# Patient Record
Sex: Female | Born: 1970 | Race: White | Hispanic: No | Marital: Single | Smoking: Current every day smoker
Health system: Southern US, Community
[De-identification: ages and names within clinical notes are randomized; demographics above are authoritative.]

---

## 2019-07-28 ENCOUNTER — Emergency Department (HOSPITAL_COMMUNITY): Payer: Medicaid Other

## 2019-07-28 ENCOUNTER — Other Ambulatory Visit: Payer: Self-pay

## 2019-07-28 ENCOUNTER — Emergency Department (HOSPITAL_COMMUNITY)
Admission: EM | Admit: 2019-07-28 | Discharge: 2019-07-29 | Disposition: A | Discharge status: Home / Self Care | Payer: Medicaid Other | Attending: Emergency Medicine | Admitting: Emergency Medicine

## 2019-07-28 ENCOUNTER — Encounter (HOSPITAL_COMMUNITY): Payer: Self-pay | Admitting: Emergency Medicine

## 2019-07-28 DIAGNOSIS — R42 Dizziness and giddiness: Secondary | ICD-10-CM | POA: Insufficient documentation

## 2019-07-28 DIAGNOSIS — F17210 Nicotine dependence, cigarettes, uncomplicated: Secondary | ICD-10-CM | POA: Insufficient documentation

## 2019-07-28 DIAGNOSIS — R0602 Shortness of breath: Secondary | ICD-10-CM | POA: Insufficient documentation

## 2019-07-28 DIAGNOSIS — R0789 Other chest pain: Secondary | ICD-10-CM | POA: Insufficient documentation

## 2019-07-28 LAB — BASIC METABOLIC PANEL WITH GFR
Anion gap: 8 (ref 5–15)
BUN: 10 mg/dL (ref 6–20)
CO2: 24 mmol/L (ref 22–32)
Calcium: 9.9 mg/dL (ref 8.9–10.3)
Chloride: 107 mmol/L (ref 98–111)
Creatinine, Ser: 0.85 mg/dL (ref 0.44–1.00)
GFR calc Af Amer: >60 mL/min
GFR calc non Af Amer: >60 mL/min
Glucose, Bld: 91 mg/dL (ref 70–99)
Potassium: 3.8 mmol/L (ref 3.5–5.1)
Sodium: 139 mmol/L (ref 135–145)

## 2019-07-28 LAB — TROPONIN I (HIGH SENSITIVITY)
Troponin I (High Sensitivity): 4 ng/L (ref <18)
Troponin I (High Sensitivity): 5 ng/L

## 2019-07-28 LAB — CBC
HCT: 43 % (ref 36.0–46.0)
Hemoglobin: 13.4 g/dL (ref 12.0–15.0)
MCH: 27.4 pg (ref 26.0–34.0)
MCHC: 31.2 g/dL (ref 30.0–36.0)
MCV: 87.9 fL (ref 80.0–100.0)
Platelets: 297 10*3/uL (ref 150–400)
RBC: 4.89 MIL/uL (ref 3.87–5.11)
RDW: 16.5 % — ABNORMAL HIGH (ref 11.5–15.5)
WBC: 10.3 10*3/uL (ref 4.0–10.5)
nRBC: 0 % (ref 0.0–0.2)

## 2019-07-28 LAB — I-STAT BETA HCG BLOOD, ED (MC, WL, AP ONLY): I-stat hCG, quantitative: <5 m[IU]/mL (ref <5)

## 2019-07-28 MED ORDER — SODIUM CHLORIDE 0.9% FLUSH
3.0000 mL | Freq: Once | INTRAVENOUS | Status: AC
  Administered 2019-07-29: 3 mL via INTRAVENOUS

## 2019-07-28 NOTE — ED Triage Notes (Signed)
Pt brought to ED by [facility] from home for CP and SOB that started 1:30 pm today when she was moving to a new house.  BP 164/106, HR 73, R 20, SPO2 99% RA CBG 105.

## 2019-07-29 ENCOUNTER — Emergency Department (HOSPITAL_COMMUNITY): Payer: Medicaid Other

## 2019-07-29 MED ORDER — LIDOCAINE VISCOUS HCL 2 % MT SOLN
15.0000 mL | Freq: Once | OROMUCOSAL | Status: AC
  Administered 2019-07-29: 15 mL via OROMUCOSAL
  Filled 2019-07-29: qty 15

## 2019-07-29 MED ORDER — ALUM & MAG HYDROXIDE-SIMETH 200-200-20 MG/5ML PO SUSP
30.0000 mL | Freq: Once | ORAL | Status: AC
  Administered 2019-07-29: 30 mL via ORAL
  Filled 2019-07-29: qty 30

## 2019-07-29 MED ORDER — LORAZEPAM 2 MG/ML IJ SOLN
1.0000 mg | Freq: Once | INTRAMUSCULAR | Status: AC
  Administered 2019-07-29: 1 mg via INTRAVENOUS
  Filled 2019-07-29: qty 1

## 2019-07-29 MED ORDER — IOHEXOL 350 MG/ML SOLN
100.0000 mL | Freq: Once | INTRAVENOUS | Status: AC | PRN
  Administered 2019-07-29: 100 mL via INTRAVENOUS

## 2019-07-29 NOTE — ED Notes (Signed)
Visual acuity -OD= 20/20, OS 20/20, OU 20/20

## 2019-07-29 NOTE — ED Notes (Signed)
Pt understood discharge instructions, able to stand and ambulate without difficulty,.

## 2019-07-29 NOTE — ED Provider Notes (Signed)
Medical screening examination/treatment/procedure(s) were conducted as a shared visit with non-physician practitioner(s) and myself.  I personally evaluated the patient during the encounter.  Patient presents with a very odd constellation of symptoms.  The main reason why she came in today is because she does have some difficulty swallowing and noticed that her right eyelid was lower than her left.  She was concern for some type of neurologic issue.  She also states she is have some chest pain is going on with this.  She is recently being evicted from her house causing massive amounts of stress and has to move to [place].  She has no upper or lower extremity symptoms. My examination it appears that her left eyelid is slightly weaker than her right and she might have some flattening of the left nasolabial fold and left mouth droop however when she smiles or uses her facial muscles there is no obvious asymmetry.  Upper extremities are symmetric lower extremities are symmetric and respect sensation and strength.  Finger-to-nose is normal.  Visual acuity pending.  She has mild heart murmur.  She states she is never been told she has a heart murmur in the past but she is also significantly hypertensive could be related to that. Overall abnormal constellation of chest and neuro symptoms will get dissection study to make sure that she does not have any vascular compromise also will get MRI of her brain to make sure she does have a history of recent stroke.  Very well could just be stress and then a previous Bell's palsy that she is now having recrudescence. I think if her imaging is negative and her work-up is negative and patient can eat and drink normally she can be discharged to follow-up with neurology as an outpatient.  EKG Interpretation  Date/Time:  Monday July 28 2019 22:01:04 EDT Ventricular Rate:  75 PR Interval:  166 QRS Duration: 84 QT Interval:  370 QTC Calculation: 413 R Axis:   93 Text  Interpretation: Normal sinus rhythm Rightward axis Borderline ECG better than previous, RAD new Confirmed by Marily Memos [id number]) on 07/28/2019 11:47:58 PM     Sigmond Patalano, Barbara Cower, MD 07/30/19 [id number]

## 2019-07-29 NOTE — ED Provider Notes (Signed)
[facility] EMERGENCY DEPARTMENT Provider Note   CSN: [id number] Arrival date & time: 07/28/19  1748     History Chief Complaint  Patient presents with  . Chest Pain    Dominique Moss is a 49 y.o. female with past medical history significant for untreated hypertension.  HPI Patient presents to emergency department today via EMS with chief complaint of acute onset of chest pain, shortness of breath and dizziness starting x 5 hours prior to arrival. Patient states she is currently moving and under a lot of stress. While in the process of moving today she noticed she had  chest pain that she describes as pressure sensation radiating across her chest. She states the chest pain has been persistent and rates it 6/10 in severity. Pain was not worse with exertion. She describes the dizziness as feeling like the room was spinning. She looked in the mirror and noticed that her right eye looked swollen. She had blurry vision from that eye. She laid down to take a nap and when she woke up the symptoms were still going on causing her to become anxious.  After becoming anxious she felt short of breath.  No medications were taken for symptoms prior to arrival.  Denies fever, chills, recent URI illness, rash, diplopia, cough, hemoptysis, dyspnea on exertion, chest tightness, radiation of chest pain to left/right arm, jaw or back, nausea, or diaphoresis. She denies family history of cardiac disease. She does admit to tobacco abuse.     History reviewed. No pertinent past medical history.  There are no problems to display for this patient.   History reviewed. No pertinent surgical history.   OB History   No obstetric history on file.     History reviewed. No pertinent family history.  Social History   Tobacco Use  . Smoking status: Current Every Day Smoker    Packs/day: 1.00    Types: Cigarettes  . Smokeless tobacco: Never Used  Substance Use Topics  . Alcohol use: Yes  .  Drug use: Never    Home Medications Prior to Admission medications   Not on File    Allergies    Patient has no known allergies.  Review of Systems   Review of Systems All other systems are reviewed and are negative for acute change except as noted in the HPI.  Physical Exam Updated Vital Signs BP (!) 142/86   Pulse 64   Temp 97.9 F (36.6 C)   Resp 12   Ht 5\' 6"  (1.676 m)   SpO2 96%   Physical Exam Vitals and nursing note reviewed.  Constitutional:      General: She is not in acute distress.    Appearance: She is not ill-appearing.  HENT:     Head: Normocephalic and atraumatic.     Right Ear: Tympanic membrane and external ear normal.     Left Ear: Tympanic membrane and external ear normal.     Nose: Nose normal.     Mouth/Throat:     Mouth: Mucous membranes are moist.     Pharynx: Oropharynx is clear.  Eyes:     General: No scleral icterus.       Right eye: No discharge.        Left eye: No discharge.     Extraocular Movements: Extraocular movements intact.     Conjunctiva/sclera: Conjunctivae normal.     Pupils: Pupils are equal, round, and reactive to light.  Neck:     Vascular: No JVD.  Cardiovascular:     Rate and Rhythm: Normal rate and regular rhythm.     Pulses: Normal pulses.          Radial pulses are 2+ on the right side and 2+ on the left side.     Heart sounds: Murmur heard.   Pulmonary:     Comments: Lungs clear to auscultation in all fields. Symmetric chest rise. No wheezing, rales, or rhonchi. Abdominal:     Comments: Abdomen is soft, non-distended, and non-tender in all quadrants. No rigidity, no guarding. No peritoneal signs.  Musculoskeletal:        General: Normal range of motion.     Cervical back: Normal range of motion.  Skin:    General: Skin is warm and dry.     Capillary Refill: Capillary refill takes less than 2 seconds.  Neurological:     Mental Status: She is oriented to person, place, and time.     GCS: GCS eye subscore  is 4. GCS verbal subscore is 5. GCS motor subscore is 6.     Comments: Fluent speech. Left eyelid is weaker compared to right. No facial asymmetry.   Mental Status:  Alert, oriented, thought content appropriate, able to give a coherent history. Speech fluent without evidence of aphasia. Able to follow 2 step commands without difficulty.  Cranial Nerves:  II:  Peripheral visual fields grossly normal, pupils equal, round, reactive to light III,IV, VI: ptosis not present, extra-ocular motions intact bilaterally  V,VII: smile symmetric, facial light touch sensation equal VIII: hearing grossly normal to voice  X: uvula elevates symmetrically  XI: bilateral shoulder shrug symmetric and strong XII: midline tongue extension without fassiculations Motor:  Normal tone. 5/5 in upper and lower extremities bilaterally including strong and equal grip strength and dorsiflexion/plantar flexion Sensory: Pinprick and light touch normal in all extremities.  Deep Tendon Reflexes: 2+ and symmetric in the biceps and patella Cerebellar: normal finger-to-nose with bilateral upper extremities Gait: normal gait and balance CV: distal pulses palpable throughout     Psychiatric:        Behavior: Behavior normal.     ED Results / Procedures / Treatments   Labs (all labs ordered are listed, but only abnormal results are displayed) Labs Reviewed  CBC - Abnormal; Notable for the following components:      Result Value   RDW 16.5 (*)    All other components within normal limits  BASIC METABOLIC PANEL  I-STAT BETA HCG BLOOD, ED ([facility], [facility], [facility] ONLY)  TROPONIN I (HIGH SENSITIVITY)  TROPONIN I (HIGH SENSITIVITY)    EKG EKG Interpretation  Date/Time:  Monday July 28 2019 22:01:04 EDT Ventricular Rate:  75 PR Interval:  166 QRS Duration: 84 QT Interval:  370 QTC Calculation: 413 R Axis:   93 Text Interpretation: Normal sinus rhythm Rightward axis Borderline ECG better than previous, RAD new Confirmed by  Marily Memos [phone]) on 07/28/2019 11:47:58 PM   Radiology DG Chest 2 View  Result Date: 07/28/2019 CLINICAL DATA:  Chest pain and shortness of breath EXAM: CHEST - 2 VIEW COMPARISON:  None FINDINGS: No consolidation, features of edema, pneumothorax, or effusion. Pulmonary vascularity is normally distributed. The cardiomediastinal contours are unremarkable. No acute osseous or soft tissue abnormality. IMPRESSION: No acute cardiopulmonary abnormality. Electronically Signed   By: Kreg Shropshire M.D.   On: 07/28/2019 19:08   MR BRAIN WO CONTRAST  Result Date: 07/29/2019 CLINICAL DATA:  Neuro deficits, subacute. EXAM: MRI HEAD WITHOUT CONTRAST TECHNIQUE: Multiplanar, multiecho  pulse sequences of the brain and surrounding structures were obtained without intravenous contrast. COMPARISON:  None. FINDINGS: Some sequences are motion degraded including moderate motion on the sagittal T1 and coronal T2 sequences. Brain: There is no evidence of acute infarct, intracranial hemorrhage, mass, midline shift, or extra-axial fluid collection. The ventricles and sulci are normal. The brain is normal in signal. 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 CSF spaces at the foramen magnum remain patent. The pituitary gland is normal in size. Vascular: Major intracranial vascular flow voids are preserved. Skull and upper cervical spine: Unremarkable bone marrow signal para Sinuses/Orbits: Unremarkable orbits. Small left sphenoid sinus mucous retention cyst. Trace left mastoid fluid. Other: None. IMPRESSION: 1. Motion degraded examination without evidence of acute intracranial abnormality. 2. 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 Electronically Signed   By: Sebastian Ache M.D.   On: 07/29/2019 06:19   CT Angio Chest/Abd/Pel for Dissection W and/or Wo Contrast  Result Date: 07/29/2019 CLINICAL DATA:  Chest pain.   Shortness of breath. EXAM: CT ANGIOGRAPHY CHEST, ABDOMEN AND PELVIS TECHNIQUE: Non-contrast CT of the chest was initially obtained. Multidetector CT imaging through the chest, abdomen and pelvis was performed using the standard protocol during bolus administration of intravenous contrast. Multiplanar reconstructed images and MIPs were obtained and reviewed to evaluate the vascular anatomy. CONTRAST:  OMNIPAQUE IOHEXOL 350 MG/ML SOLN COMPARISON:  Chest radiographs 07/28/2019 FINDINGS: CTA CHEST FINDINGS Cardiovascular: No thoracic aortic intramural hematoma is identified on noncontrast images within limitations of mild motion artifact. There is no evidence of a thoracic aortic dissection or aneurysm. No central pulmonary embolism is identified on this nondedicated study. The heart is normal in size. There is no pericardial effusion. Mediastinum/Nodes: Unremarkable included thyroid. No enlarged axillary, mediastinal, or hilar lymph nodes. Moderate-sized sliding hiatal hernia. Lungs/Pleura: No pleural effusion or pneumothorax. Ill-defined centrilobular ground-glass nodularity bilaterally. No mass or consolidation. Musculoskeletal: No acute osseous abnormality or suspicious osseous lesion. Review of the MIP images confirms the above findings. CTA ABDOMEN AND PELVIS FINDINGS VASCULAR Aorta: Mild abdominal aortic atherosclerosis without aneurysm, dissection, or stenosis. Celiac: Patent without evidence of aneurysm, dissection, vasculitis or significant stenosis. SMA: Patent without evidence of aneurysm, dissection, vasculitis or significant stenosis. Renals: The renal arteries are patent bilaterally without evidence of aneurysm, dissection, vasculitis, fibromuscular dysplasia, or significant stenosis. Accessory renal arteries bilaterally. IMA: Patent without evidence of aneurysm, dissection, vasculitis or significant stenosis. Inflow: Patent without evidence of aneurysm, dissection, vasculitis or significant  stenosis. Veins: No obvious venous abnormality within the limitations of this arterial phase study. Review of the MIP images confirms the above findings. NON-VASCULAR Hepatobiliary: No focal liver abnormality is seen. No gallstones, gallbladder wall thickening, or biliary dilatation. Pancreas: Unremarkable. Spleen: Small splenic calcifications. Adrenals/Urinary Tract: Unremarkable adrenal glands. Excreted contrast in the renal collecting systems limiting assessment for calculi. No hydronephrosis. Subcentimeter low-density lesions in the right kidney, too small to fully characterize. Unremarkable bladder. Stomach/Bowel: There is no evidence of bowel obstruction or inflammation. The appendix is unremarkable. Lymphatic: No enlarged lymph nodes. Reproductive: 4 cm posterior uterine mass. Two homogeneously hypodense foci in the left ovary with the larger measuring 2.3 cm, likely benign follicles. Other: No intraperitoneal free fluid.  No pneumoperitoneum. Musculoskeletal: Moderate disc and facet degeneration at L5-S1 with left greater than right neural foraminal stenosis. Review of the MIP images confirms the above findings. IMPRESSION: 1. No evidence of aortic dissection, aneurysm, or other acute abnormality in  the chest, abdomen, or pelvis. 2. Centrilobular ground-glass nodularity in both lungs which may reflect respiratory bronchiolitis. 3. Moderate-sized hiatal hernia. 4. 4 cm uterine mass, likely a fibroid. 5. Aortic Atherosclerosis (ICD10-I70.0). Electronically Signed   By: Sebastian AcheAllen  Grady M.D.   On: 07/29/2019 07:00    Procedures Procedures (including critical care time)  Medications Ordered in ED Medications  sodium chloride flush (NS) 0.9 % injection 3 mL (3 mLs Intravenous Given 07/29/19 0510)  LORazepam (ATIVAN) injection 1 mg (1 mg Intravenous Given 07/29/19 0509)  iohexol (OMNIPAQUE) 350 MG/ML injection 100 mL (100 mLs Intravenous Contrast Given 07/29/19 0559)  lidocaine (XYLOCAINE) 2 % viscous mouth  solution 15 mL (15 mLs Mouth/Throat Given 07/29/19 0702)  alum & mag hydroxide-simeth (MAALOX/MYLANTA) 200-200-20 MG/5ML suspension 30 mL (30 mLs Oral Given 07/29/19 0702)    ED Course  I have reviewed the triage vital signs and the nursing notes.  Pertinent labs & imaging results that were available during my care of the patient were reviewed by me and considered in my medical decision making (see chart for details).    MDM Rules/Calculators/A&P                          History provided by patient with additional history obtained from chart review.    49 yo female presenting with chest pain and neuro symptoms. She is hypertensive on arrival. She denied any recent URI illness or history of Bells Palsy. Neuro exam shows left eyelid weaker compared to the right, no facial droop with smile, possible flattening of left nasolabial fold at rest.  Neurologic deficits of all extremities.  Finger-to-nose intact.  She does have a heart murmur on exam that she denies being told that in the past.  With her concerning chest pain will proceed with dissection study as she also has a new murmur on exam. ED attending Dr. Clayborne DanaMesner examined patient and discussed case with on call neurologist Dr. Amada JupiterKirkpatrick who recommends MR brain although does feel anxiety could be cause.  CBC, BMP unremarkable.  Pregnancy test is negative.  Delta troponin flat.  EKG without obvious ischemia. Visual acuity normal. I viewed pt's chest xray and it does not suggest acute infectious processes Ativan given. BP and symptoms improved. MR brain negative for stroke.  CT dissection study is also negative. Unsure if this is new Bells Palsy or not given she has no recent URI symptoms, engaged in shared decision making and patient does not wish to have Bells palsy treatment.  Gi cocktail given and on reassessment is tolerating PO intake without difficulty.   Given her symptoms have improved, will have her follow up outpatient with neurology.     The patient appears reasonably screened and/or stabilized for discharge and I doubt any other medical condition or other [facility] requiring further screening, evaluation, or treatment in the ED at this time prior to discharge. The patient is safe for discharge with strict return precautions discussed. Shared ED visit with ED attending.   Portions of this note were generated with Scientist, clinical (histocompatibility and immunogenetics)Dragon dictation software. Dictation errors may occur despite best attempts at proofreading.    Final Clinical Impression(s) / ED Diagnoses Final diagnoses:  Atypical chest pain    Rx / DC Orders ED Discharge Orders    None       Kathyrn Lasslbrizze, Makalynn Berwanger E, PA-C 07/29/19 1828    Mesner, Barbara CowerJason, MD 07/30/19 [id number]

## 2019-07-29 NOTE — Discharge Instructions (Addendum)
MRI did not show stroke.  CT scan showed a hiatal hernia, no other severe findings. This could be an old finding.   You should follow up with a regular doctor to have your blood pressure rechecked as it was elevated today.  Also recommend you follow-up with neurology.  Have given you the contact information that you can call to schedule an appointment for the next available.  Return to the emergency department if your symptoms worsen.

## 2019-07-29 NOTE — ED Notes (Signed)
Patient transported to MRI 

## 2019-07-29 NOTE — ED Notes (Signed)
Speech slurred, sleepy, falls asleep during exam, easy to awaken.

## 2021-04-10 IMAGING — MR MR HEAD W/O CM
12 of 13 series · 43 of 48 positions shown · non-contrast
Comparison: None.

CLINICAL DATA: Neuro deficits, subacute.

EXAM:
MRI HEAD WITHOUT CONTRAST
TECHNIQUE: Multiplanar, multiecho pulse sequences of the brain and surrounding
structures were obtained without intravenous contrast.

[Series 5: DWI · axial · 3.0mm · 0.88mm/px · z∈[-69,+83]mm · 9 of 104 slices shown (1 of 4)]
[im 1/104]
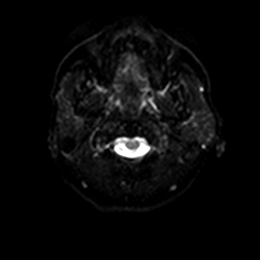
[im 13/104]
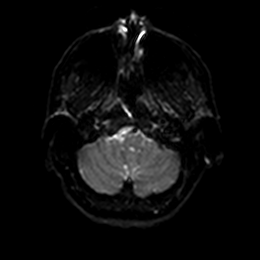
[im 26/104]
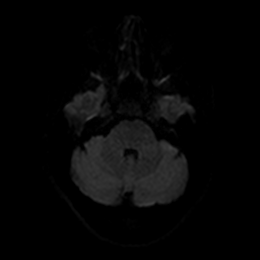
[im 39/104]
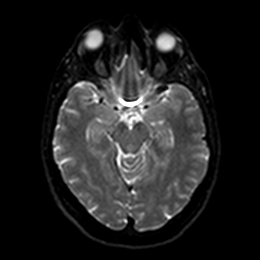
[im 52/104]
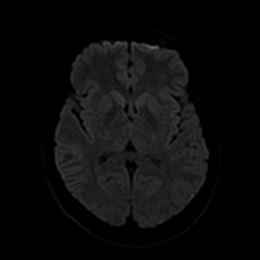
[im 65/104]
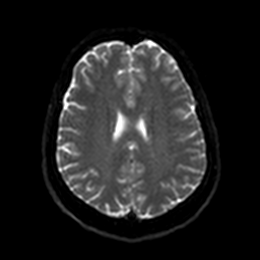
[im 78/104]
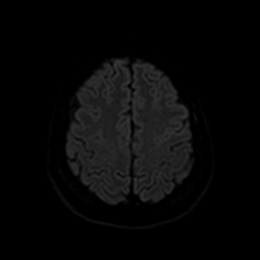
[im 91/104]
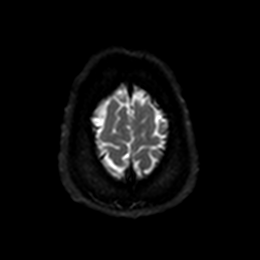
[im 104/104]
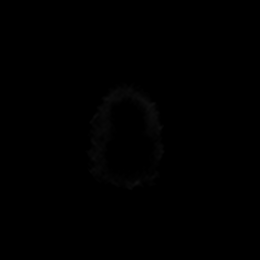

[Series 6: DWI · axial · 3.0mm · 0.88mm/px · z∈[-69,+83]mm · 4 of 52 slices shown (2 of 4)]
[im 1/52]
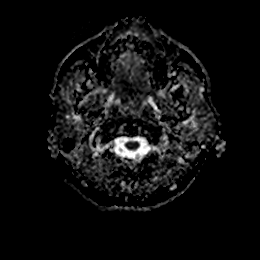
[im 18/52]
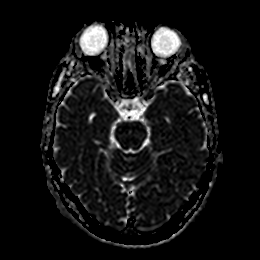
[im 35/52]
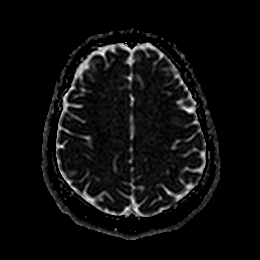
[im 52/52]
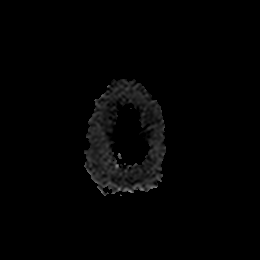

[Series 7: DWI · coronal · 4.0mm · 0.88mm/px · 6 of 66 slices shown (3 of 4)]
[im 1/66]
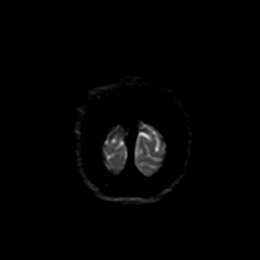
[im 14/66]
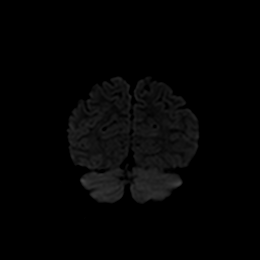
[im 27/66]
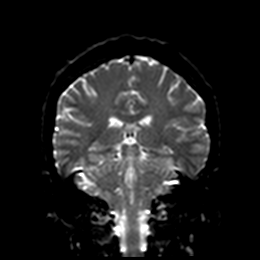
[im 40/66]
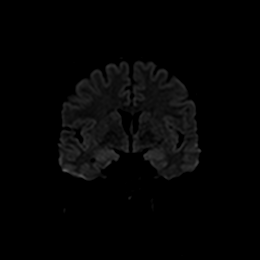
[im 53/66]
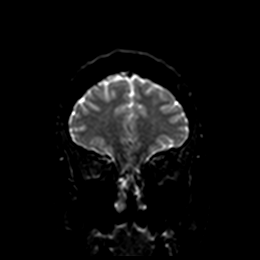
[im 66/66]
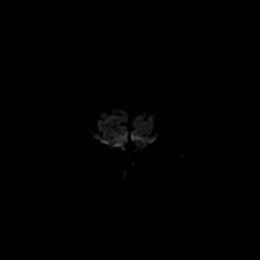

[Series 8: DWI · coronal · 4.0mm · 0.88mm/px · 3 of 33 slices shown (4 of 4)]
[im 1/33]
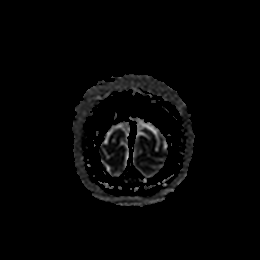
[im 17/33]
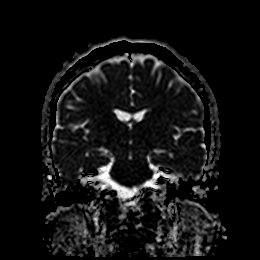
[im 33/33]
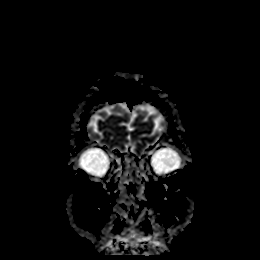

[Series 9: T1 · sagittal · 5.0mm · 0.75mm/px · 2 of 23 slices shown (1 of 2)]
[im 1/23]
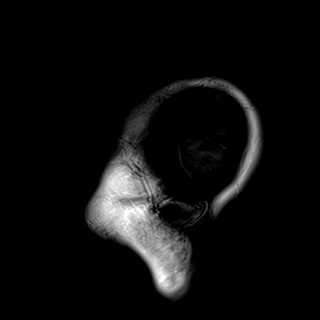
[im 23/23]
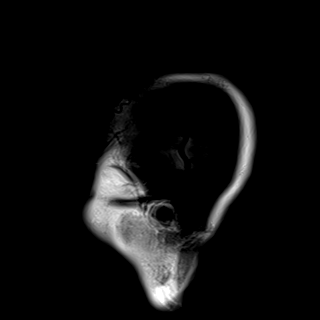

[Series 10: T2 · axial · 5.0mm · 0.72mm/px · z∈[-72,+84]mm · 2 of 27 slices shown (1 of 2)]
[im 1/27]
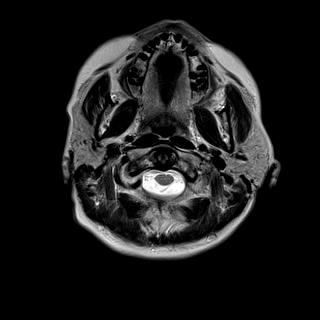
[im 27/27]
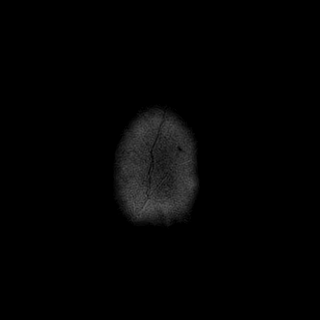

[Series 11: FLAIR · axial · 5.0mm · 0.45mm/px · z∈[-72,+83]mm · 2 of 27 slices shown (1 of 2)]
[im 1/27]
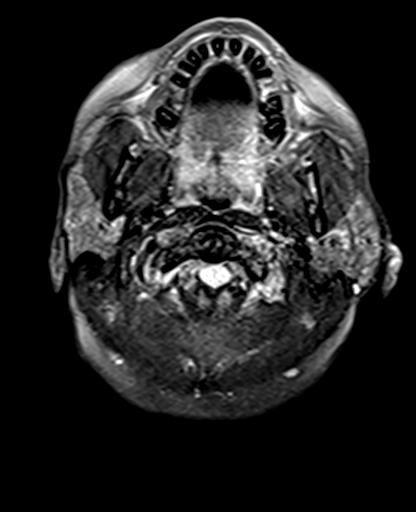
[im 27/27]
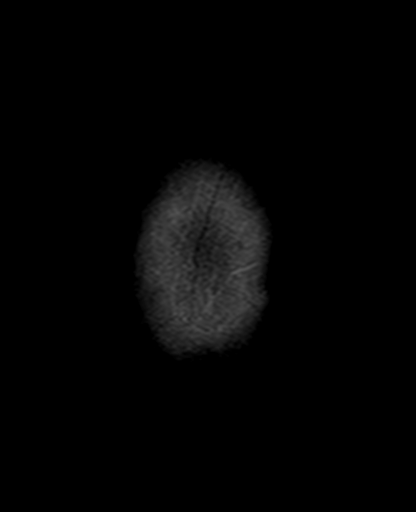

[Series 13: pha_images · axial · 3.0mm · 0.90mm/px · z∈[-78,+87]mm · 5 of 56 slices shown]
[im 1/56]
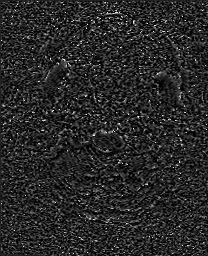
[im 14/56]
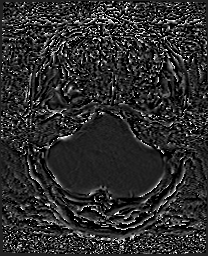
[im 28/56]
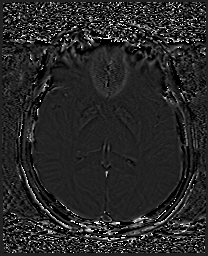
[im 42/56]
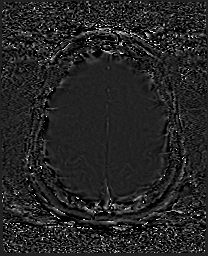
[im 56/56]
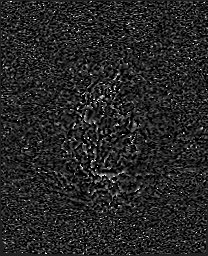

[Series 14: swi_images · axial · 3.0mm · 0.90mm/px · z∈[-78,+99]mm · 5 of 60 slices shown]
[im 1/60]
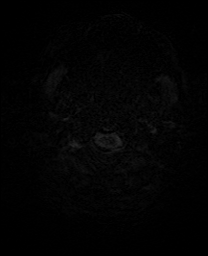
[im 15/60]
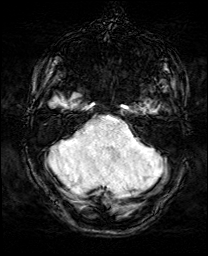
[im 30/60]
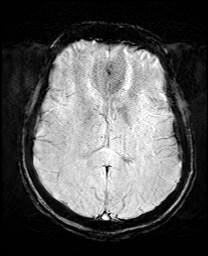
[im 45/60]
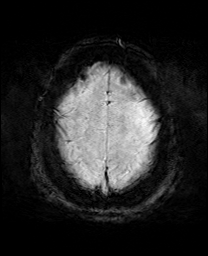
[im 60/60]
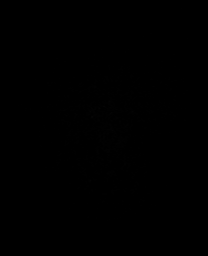

[Series 16: T1 · sagittal · 5.0mm · 0.75mm/px · 2 of 23 slices shown (2 of 2)]
[im 1/23]
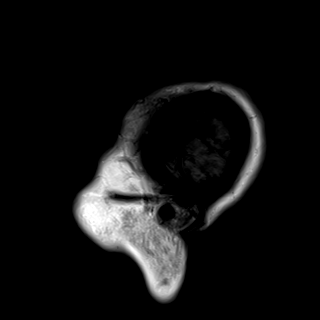
[im 23/23]
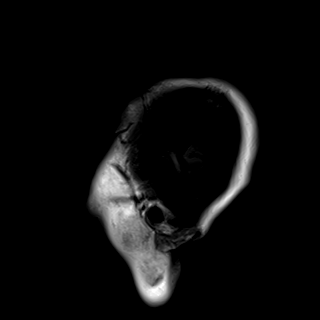

[Series 18: T2 · coronal · 5.0mm · 0.34mm/px · 2 of 29 slices shown (2 of 2)]
[im 1/29]
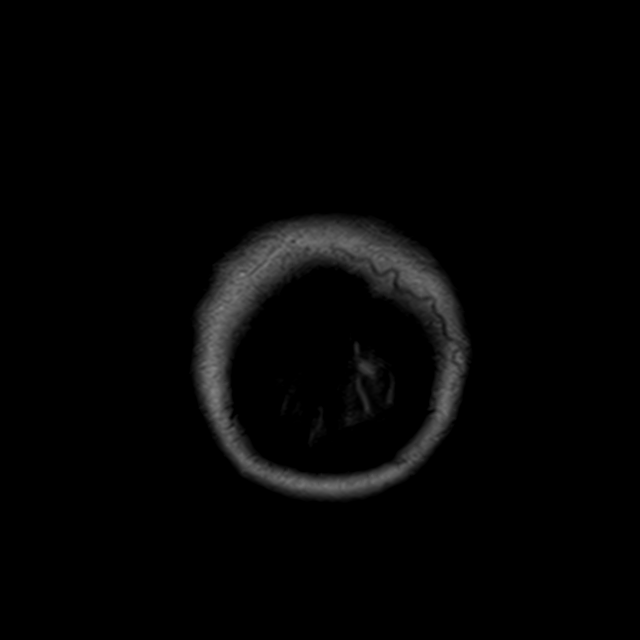
[im 29/29]
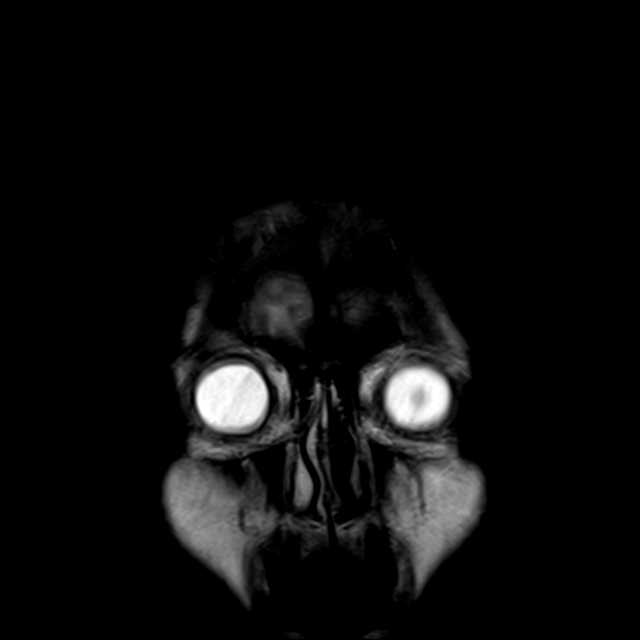

[Series 19: FLAIR · axial · 5.0mm · 0.45mm/px · 1 of 14 slices shown (2 of 2)]
[im 1/14]
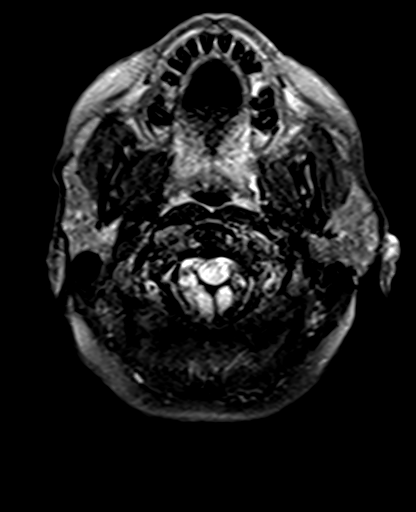

[43 of 48 positions shown; findings below may reference images not displayed]

FINDINGS: Some sequences are motion degraded including moderate motion on the
sagittal T1 and coronal T2 sequences.

Brain: There is no evidence of acute infarct, intracranial
hemorrhage, mass, midline shift, or extra-axial fluid collection.
The ventricles and sulci are normal. The brain is normal in signal.
Cerebellar tonsillar ectopia measures 7-8 mm with grossly normal
tonsillar morphology within limitations of motion artifact. CSF
spaces at the foramen magnum remain patent. The pituitary gland is
normal in size.

Vascular: Major intracranial vascular flow voids are preserved.

Skull and upper cervical spine: Unremarkable bone marrow signal para

Sinuses/Orbits: Unremarkable orbits. Small left sphenoid sinus
mucous retention cyst. Trace left mastoid fluid.

Other: None.
IMPRESSION: 1. Motion degraded examination without evidence of acute
intracranial abnormality.
2. 7-8 mm of cerebellar tonsillar ectopia/mild Chiari I
malformation.
